# Patient Record
Sex: Male | Born: 1985 | Hispanic: No | Marital: Married | Smoking: Never smoker
Health system: Southern US, Community
[De-identification: ages and names within clinical notes are randomized; demographics above are authoritative.]

## PROBLEM LIST (undated history)

## (undated) DIAGNOSIS — Z720 Tobacco use: Secondary | ICD-10-CM

## (undated) HISTORY — DX: Tobacco use: Z72.0

---

## 2020-05-24 ENCOUNTER — Ambulatory Visit: Payer: Medicaid Other | Admitting: Family Medicine

## 2020-05-24 ENCOUNTER — Other Ambulatory Visit: Payer: Self-pay

## 2020-05-24 ENCOUNTER — Other Ambulatory Visit (HOSPITAL_COMMUNITY)
Admission: RE | Admit: 2020-05-24 | Discharge: 2020-05-24 | Disposition: A | Discharge status: Home / Self Care | Payer: Medicaid Other | Source: Ambulatory Visit | Attending: Family Medicine | Admitting: Family Medicine

## 2020-05-24 VITALS — BP 118/74 | HR 66 | Ht 65.5 in | Wt 174.0 lb

## 2020-05-24 DIAGNOSIS — F5101 Primary insomnia: Secondary | ICD-10-CM

## 2020-05-24 DIAGNOSIS — M79641 Pain in right hand: Secondary | ICD-10-CM | POA: Diagnosis not present

## 2020-05-24 DIAGNOSIS — R8281 Pyuria: Secondary | ICD-10-CM

## 2020-05-24 DIAGNOSIS — Z0289 Encounter for other administrative examinations: Secondary | ICD-10-CM | POA: Diagnosis present

## 2020-05-24 DIAGNOSIS — G4700 Insomnia, unspecified: Secondary | ICD-10-CM | POA: Insufficient documentation

## 2020-05-24 LAB — POCT URINALYSIS DIP (MANUAL ENTRY)
Bilirubin, UA: NEGATIVE
Blood, UA: NEGATIVE
Glucose, UA: NEGATIVE mg/dL
Ketones, POC UA: NEGATIVE mg/dL
Nitrite, UA: NEGATIVE
Protein Ur, POC: NEGATIVE mg/dL
Spec Grav, UA: 1.015 (ref 1.010–1.025)
Urobilinogen, UA: 0.2 E.U./dL
pH, UA: 5.5 (ref 5.0–8.0)

## 2020-05-24 MED ORDER — ACETAMINOPHEN 325 MG PO TABS: 650.0000 mg | ORAL_TABLET | Freq: Four times a day (QID) | ORAL | 0 refills | Status: AC | PRN

## 2020-05-24 MED ORDER — ALBENDAZOLE 200 MG PO TABS: 400.0000 mg | ORAL_TABLET | Freq: Once | ORAL | 0 refills | Status: AC

## 2020-05-24 MED ORDER — IVERMECTIN 3 MG PO TABS: 200.0000 ug/kg | ORAL_TABLET | Freq: Every day | ORAL | 0 refills | Status: AC

## 2020-05-24 MED ORDER — MELATONIN 1 MG PO CAPS: 1.0000 mg | ORAL_CAPSULE | Freq: Every day | ORAL | 1 refills | Status: AC

## 2020-05-24 NOTE — Assessment & Plan Note (Signed)
-  tylenol for pain relief as needed -ordered XR right hand

## 2020-05-24 NOTE — Assessment & Plan Note (Signed)
-  melatonin prescribed daily at bedtime, reviewed and discussed with patient -consider PHQ-9 and mood check at follow up visit

## 2020-05-24 NOTE — Progress Notes (Signed)
Seen and examined with Dr. Robyne Peers and Dr. Pecola Leisure.  Terisa Starr, MD  Family Medicine Teaching Service

## 2020-05-24 NOTE — Patient Instructions (Addendum)
It was wonderful to see you today.  Please bring ALL of your medications with you to every visit.   Today we talked about:  -Your right hand pain, we prescribed tylenol to help alleviate the pain. I have also ordered a right hand x-ray, imaging will help [place] get a better idea of what may be causing your paining.   -We prescribed melatonin to help with sleep, please take this every night before going to bed.    Thank you for choosing [facility].   Please call [phone] with any questions about today's appointment.  Please be sure to schedule follow up at the front  desk before you leave today.   Reece Leader, DO Terisa Starr, MD  Family Medicine

## 2020-05-24 NOTE — Assessment & Plan Note (Addendum)
-  pending labs, will notify patient with abnormal results -ivermectin and albendazole prescribed for prophylactic therapy -follow up 06/08/2020
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
## 2020-05-25 LAB — CBC WITH DIFFERENTIAL/PLATELET
Basophils Absolute: 0 10*3/uL (ref 0.0–0.2)
Basos: 1 %
EOS (ABSOLUTE): 0.1 10*3/uL (ref 0.0–0.4)
Eos: 3 %
Hematocrit: 47.7 % (ref 37.5–51.0)
Hemoglobin: 16.4 g/dL (ref 13.0–17.7)
Immature Grans (Abs): 0 10*3/uL (ref 0.0–0.1)
Immature Granulocytes: 0 %
Lymphocytes Absolute: 1.8 10*3/uL (ref 0.7–3.1)
Lymphs: 32 %
MCH: 30 pg (ref 26.6–33.0)
MCHC: 34.4 g/dL (ref 31.5–35.7)
MCV: 87 fL (ref 79–97)
Monocytes Absolute: 0.4 10*3/uL (ref 0.1–0.9)
Monocytes: 8 %
Neutrophils Absolute: 3.1 10*3/uL (ref 1.4–7.0)
Neutrophils: 56 %
Platelets: 276 10*3/uL (ref 150–450)
RBC: 5.46 x10E6/uL (ref 4.14–5.80)
RDW: 12.5 % (ref 11.6–15.4)
WBC: 5.5 10*3/uL (ref 3.4–10.8)

## 2020-05-25 LAB — URINE CYTOLOGY ANCILLARY ONLY
Chlamydia: NEGATIVE
Comment: NEGATIVE
Comment: NORMAL
Neisseria Gonorrhea: NEGATIVE

## 2020-05-25 LAB — LIPID PANEL
Chol/HDL Ratio: 5.5 ratio — ABNORMAL HIGH (ref 0.0–5.0)
Cholesterol, Total: 170 mg/dL (ref 100–199)
HDL: 31 mg/dL — ABNORMAL LOW (ref >39)
LDL Chol Calc (NIH): 89 mg/dL (ref 0–99)
Triglycerides: 298 mg/dL — ABNORMAL HIGH (ref 0–149)
VLDL Cholesterol Cal: 50 mg/dL — ABNORMAL HIGH (ref 5–40)

## 2020-05-25 LAB — COMPREHENSIVE METABOLIC PANEL
ALT: 16 IU/L (ref 0–44)
AST: 15 IU/L (ref 0–40)
Albumin/Globulin Ratio: 1.7 (ref 1.2–2.2)
Albumin: 4.5 g/dL (ref 4.0–5.0)
Alkaline Phosphatase: 97 IU/L (ref 44–121)
BUN/Creatinine Ratio: 11 (ref 9–20)
BUN: 9 mg/dL (ref 6–20)
Bilirubin Total: 0.3 mg/dL (ref 0.0–1.2)
CO2: 23 mmol/L (ref 20–29)
Calcium: 9.3 mg/dL (ref 8.7–10.2)
Chloride: 100 mmol/L (ref 96–106)
Creatinine, Ser: 0.85 mg/dL (ref 0.76–1.27)
Globulin, Total: 2.6 g/dL (ref 1.5–4.5)
Glucose: 92 mg/dL (ref 65–99)
Potassium: 4 mmol/L (ref 3.5–5.2)
Sodium: 137 mmol/L (ref 134–144)
Total Protein: 7.1 g/dL (ref 6.0–8.5)
eGFR: 115 mL/min/{1.73_m2} (ref >59)

## 2020-05-25 LAB — HEPATITIS B SURFACE ANTIGEN: Hepatitis B Surface Ag: NEGATIVE

## 2020-05-25 LAB — HCV INTERPRETATION

## 2020-05-25 LAB — HIV ANTIBODY (ROUTINE TESTING W REFLEX): HIV Screen 4th Generation wRfx: NONREACTIVE

## 2020-05-25 LAB — TSH: TSH: 1.99 u[IU]/mL (ref 0.450–4.500)

## 2020-05-25 LAB — VARICELLA ZOSTER ANTIBODY, IGG: Varicella zoster IgG: 200 index (ref >165)

## 2020-05-25 LAB — HEPATITIS B CORE ANTIBODY, TOTAL: Hep B Core Total Ab: POSITIVE — AB

## 2020-05-25 LAB — RPR: RPR Ser Ql: NONREACTIVE

## 2020-05-25 LAB — HCV AB W REFLEX TO QUANT PCR: HCV Ab: <0.1 s/co ratio (ref 0.0–0.9)

## 2020-05-25 LAB — HEPATITIS B SURFACE ANTIBODY, QUANTITATIVE: Hepatitis B Surf Ab Quant: >1000 m[IU]/mL (ref >9.9)

## 2020-05-26 ENCOUNTER — Telehealth: Payer: Self-pay | Admitting: Family Medicine

## 2020-05-26 NOTE — Telephone Encounter (Signed)
Called with Pashto interpreter.   Unable to reach patient, will send letter.

## 2020-05-30 LAB — URINE CULTURE

## 2020-06-08 ENCOUNTER — Ambulatory Visit: Payer: Medicaid Other | Admitting: Family Medicine

## 2020-06-14 ENCOUNTER — Encounter: Payer: Self-pay | Admitting: Family Medicine

## 2020-06-14 ENCOUNTER — Other Ambulatory Visit: Payer: Self-pay

## 2020-06-14 ENCOUNTER — Ambulatory Visit
Admission: RE | Admit: 2020-06-14 | Discharge: 2020-06-14 | Disposition: A | Discharge status: Home / Self Care | Payer: Medicaid Other | Source: Ambulatory Visit | Attending: Family Medicine | Admitting: Family Medicine

## 2020-06-14 ENCOUNTER — Ambulatory Visit (INDEPENDENT_AMBULATORY_CARE_PROVIDER_SITE_OTHER): Payer: Medicaid Other | Admitting: Family Medicine

## 2020-06-14 VITALS — BP 126/68 | HR 70 | Wt 163.0 lb

## 2020-06-14 DIAGNOSIS — B952 Enterococcus as the cause of diseases classified elsewhere: Secondary | ICD-10-CM

## 2020-06-14 DIAGNOSIS — R8281 Pyuria: Secondary | ICD-10-CM | POA: Diagnosis present

## 2020-06-14 DIAGNOSIS — M79641 Pain in right hand: Secondary | ICD-10-CM

## 2020-06-14 DIAGNOSIS — N390 Urinary tract infection, site not specified: Secondary | ICD-10-CM | POA: Diagnosis not present

## 2020-06-14 DIAGNOSIS — E781 Pure hyperglyceridemia: Secondary | ICD-10-CM | POA: Diagnosis not present

## 2020-06-14 DIAGNOSIS — G4700 Insomnia, unspecified: Secondary | ICD-10-CM

## 2020-06-14 LAB — POCT URINALYSIS DIP (MANUAL ENTRY)
Bilirubin, UA: NEGATIVE
Blood, UA: NEGATIVE
Glucose, UA: NEGATIVE mg/dL
Ketones, POC UA: NEGATIVE mg/dL
Leukocytes, UA: NEGATIVE
Nitrite, UA: NEGATIVE
Protein Ur, POC: NEGATIVE mg/dL
Spec Grav, UA: 1.025 (ref 1.010–1.025)
Urobilinogen, UA: 0.2 E.U./dL
pH, UA: 7 (ref 5.0–8.0)

## 2020-06-14 MED ORDER — NITROFURANTOIN MONOHYD MACRO 100 MG PO CAPS: 100.0000 mg | ORAL_CAPSULE | Freq: Two times a day (BID) | ORAL | 0 refills | Status: AC

## 2020-06-14 NOTE — Patient Instructions (Addendum)
It was great seeing you today!  I have prescribed macrobid 100 mg bid for 10 days. Please take the complete course of this even if you continue to not have symptoms.   Please follow up at your next scheduled appointment, if anything arises between now and then, please don't hesitate to contact our office.   Thank you for allowing [place] to be a part of your medical care!  Thank you, Dr. Robyne Peers

## 2020-06-14 NOTE — Assessment & Plan Note (Signed)
-  repeat UA negative -prescribed macrobid 100 mg bid for 10 days, instructed to continue to take complete course even if remains asymptomatic  -return precautions

## 2020-06-14 NOTE — Assessment & Plan Note (Signed)
-  instructed to use tylenol as needed -right hand imaging ordered, instructed to ensure he gets this imaging done, made sponsor aware of this as well  -recommended rest when able although job limits this

## 2020-06-14 NOTE — Assessment & Plan Note (Addendum)
-  continue melatonin  -reassurance provided as many stressors likely contributing to this

## 2020-06-14 NOTE — Assessment & Plan Note (Signed)
-  Most recent lipid panel notable for elevated triglycerides of 298 -no initiation of statin therapy at this time -diet and exercise counseling provided
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
## 2020-06-16 ENCOUNTER — Encounter: Payer: Self-pay | Admitting: Family Medicine

## 2021-05-31 IMAGING — CR DG HAND COMPLETE 3+V*R*
3 series · 3 of 3 positions shown · non-contrast
Comparison: None.

CLINICAL DATA: Pain

EXAM:
RIGHT HAND - COMPLETE 3+ VIEW

[x hand pa right]
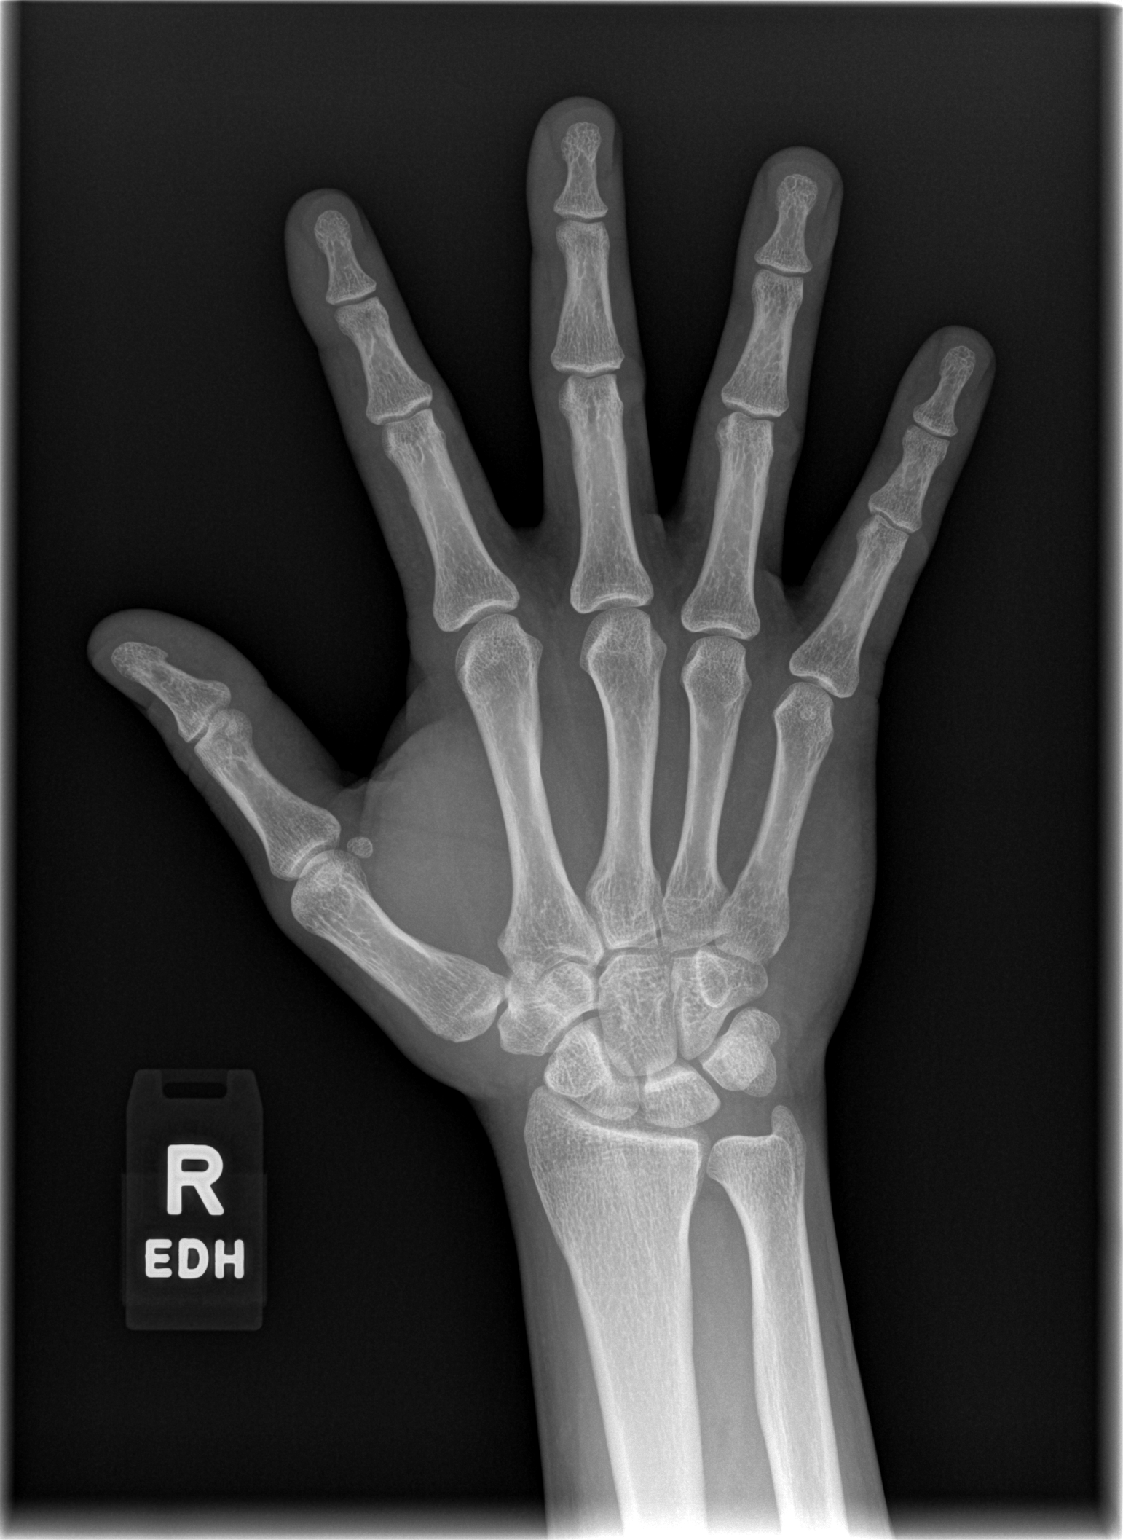

[x hand oblique right]
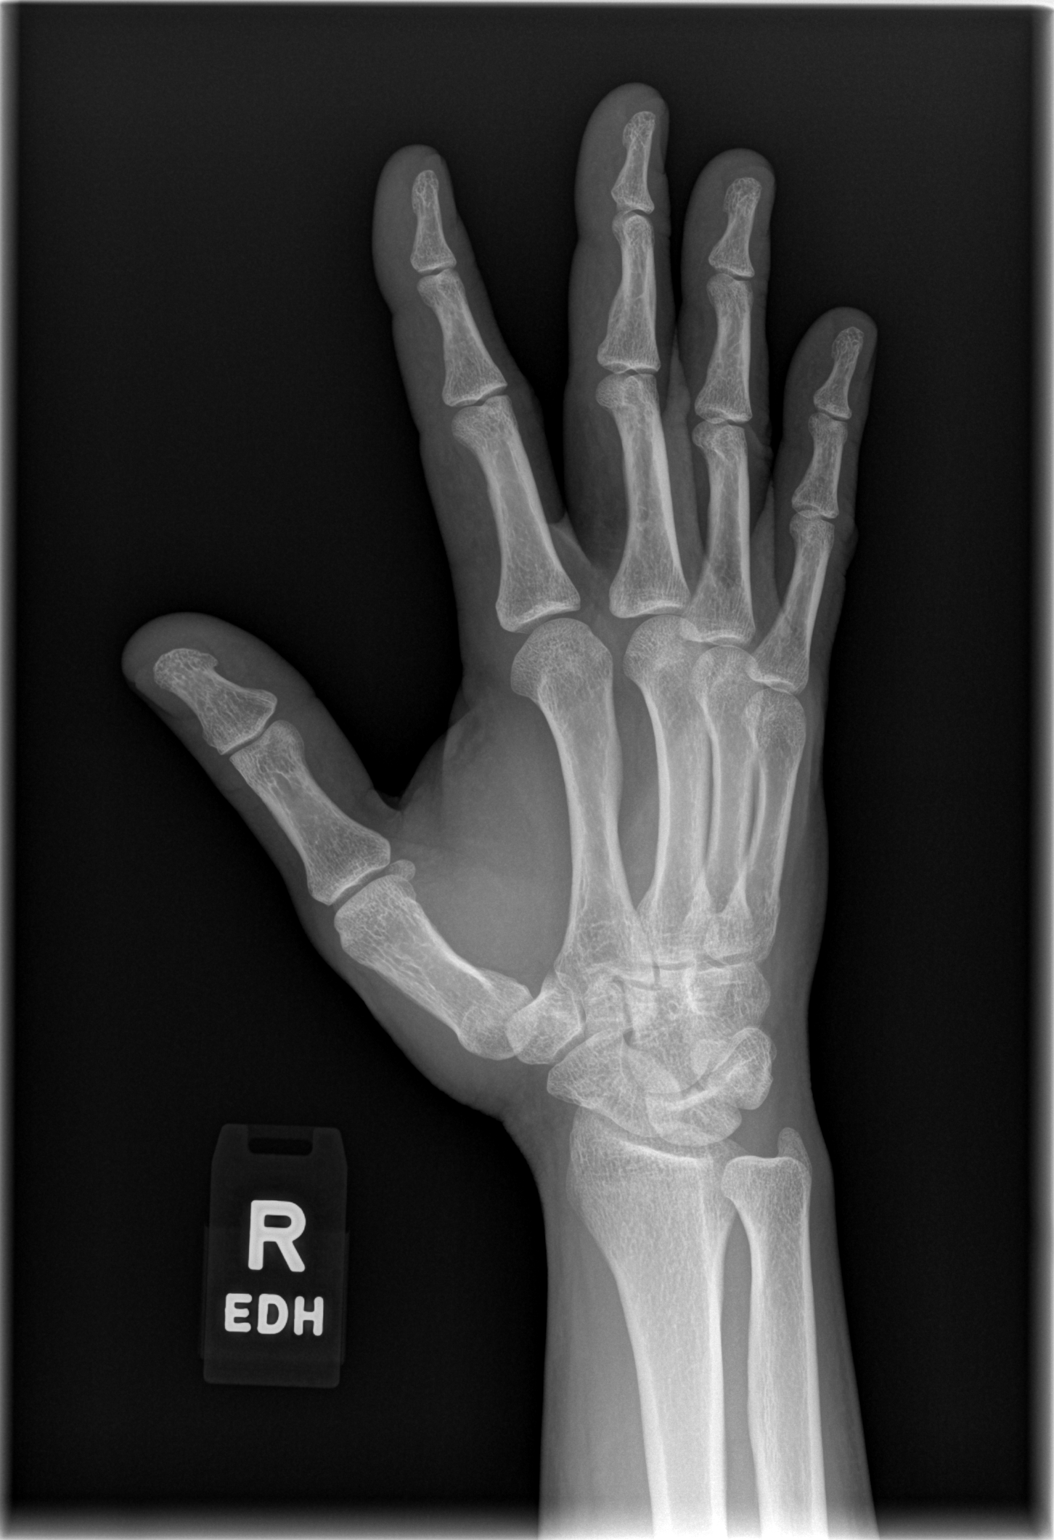

[x hand lat right]
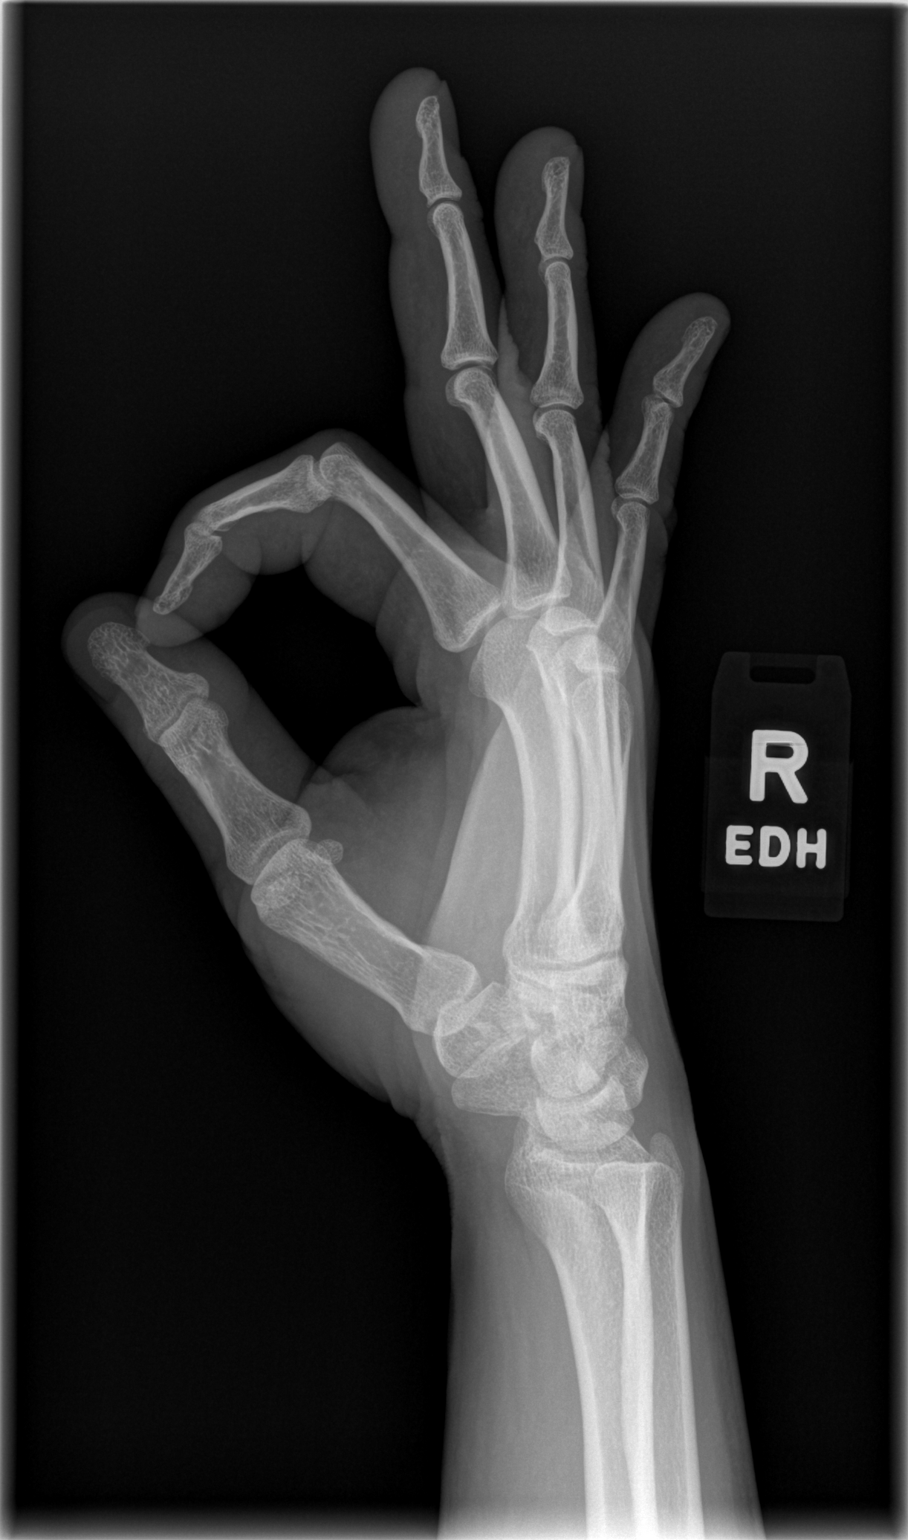

[3 of 3 positions shown; findings below may reference images not displayed]

FINDINGS: Frontal, oblique, and lateral views were obtained. No fracture or
dislocation. Joint spaces appear normal. No erosive change.
IMPRESSION: No fracture or dislocation.  No appreciable arthropathy.
